# Patient Record
Sex: Female | Born: 2005 | Race: White | Hispanic: No | Marital: Single | Smoking: Never smoker
Health system: Southern US, Community
[De-identification: ages and names within clinical notes are randomized; demographics above are authoritative.]

## PROBLEM LIST (undated history)

## (undated) DIAGNOSIS — M21612 Bunion of left foot: Secondary | ICD-10-CM

## (undated) DIAGNOSIS — F909 Attention-deficit hyperactivity disorder, unspecified type: Secondary | ICD-10-CM

## (undated) DIAGNOSIS — M21611 Bunion of right foot: Secondary | ICD-10-CM

## (undated) HISTORY — DX: Bunion of left foot: M21.612

## (undated) HISTORY — DX: Bunion of right foot: M21.611

## (undated) HISTORY — DX: Attention-deficit hyperactivity disorder, unspecified type: F90.9

---
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
## 2023-01-30 ENCOUNTER — Encounter: Payer: PRIVATE HEALTH INSURANCE | Admitting: Obstetrics and Gynecology

## 2023-04-23 NOTE — Progress Notes (Deleted)
17 y.o. No obstetric history on file. Single Caucasian female here for NEW GYN/AEX.    PCP:     No LMP recorded. (Menstrual status: Irregular Periods).           Sexually active: {yes no:314532}  The current method of family planning is {contraception:315051}.    Exercising: {yes no:314532}  {types:19826} Smoker:  no  Health Maintenance: Pap:  n/a History of abnormal Pap:  no MMG:  n/a Colonoscopy:  n/a BMD:   n/a  Result  n/a TDaP:  *** Gardasil:   {YES NO:22349} HIV: Hep C: Screening Labs:  Hb today: ***, Urine today: ***   reports that she has never smoked. She has never used smokeless tobacco.  Past Medical History:  Diagnosis Date   ADHD (attention deficit hyperactivity disorder)    Bilateral bunions    Bilaterl Tailor bunions    No past surgical history on file.  Current Outpatient Medications  Medication Sig Dispense Refill   guanFACINE (INTUNIV) 2 MG TB24 ER tablet Take 2 mg by mouth daily.     JUNEL FE 1.5/30 1.5-30 MG-MCG tablet Take 1 tablet by mouth daily. (Patient not taking: Reported on 08/08/2020)     levothyroxine (SYNTHROID) 25 MCG tablet 50 MCG/DAY FOR THREE DAYS EACH WEEK AND 25 MCG/DAY FOR FOUR DAYS EACH WEEK. 100 tablet 1   Multiple Vitamins-Minerals (MULTIVITAMIN WITH MINERALS) tablet Take 1 tablet by mouth daily. (Patient not taking: Reported on 09/06/2021)     No current facility-administered medications for this visit.    Family History  Problem Relation Age of Onset   ADD / ADHD Mother    Asperger's syndrome Mother    Asthma Mother    Osteoarthritis Mother    Anxiety disorder Father    Depression Father    Polycystic ovary syndrome Sister    Scoliosis Sister    Migraines Sister    Sinusitis Sister    Kidney failure Maternal Grandmother    Hypertension Maternal Grandmother    Stroke Maternal Grandmother    Endometrial cancer Maternal Grandmother    Osteoarthritis Maternal Grandmother    Hypothyroidism Maternal Grandmother     Hypotension Maternal Grandmother    Bipolar disorder Maternal Grandmother    Depression Maternal Grandmother    Pancreatic cancer Maternal Grandfather    Hypertension Maternal Grandfather    Asperger's syndrome Maternal Grandfather    Memory loss Maternal Grandfather    Hyperlipidemia Paternal Grandmother    Heart murmur Paternal Grandmother    Hypothyroidism Paternal Grandmother    Osteoarthritis Paternal Grandfather     Review of Systems  Exam:   There were no vitals taken for this visit.    General appearance: alert, cooperative and appears stated age Head: normocephalic, without obvious abnormality, atraumatic Neck: no adenopathy, supple, symmetrical, trachea midline and thyroid normal to inspection and palpation Lungs: clear to auscultation bilaterally Breasts: normal appearance, no masses or tenderness, No nipple retraction or dimpling, No nipple discharge or bleeding, No axillary adenopathy Heart: regular rate and rhythm Abdomen: soft, non-tender; no masses, no organomegaly Extremities: extremities normal, atraumatic, no cyanosis or edema Skin: skin color, texture, turgor normal. No rashes or lesions Lymph nodes: cervical, supraclavicular, and axillary nodes normal. Neurologic: grossly normal  Pelvic: External genitalia:  no lesions              No abnormal inguinal nodes palpated.              Urethra:  normal appearing urethra with no masses, tenderness  or lesions              Bartholins and Skenes: normal                 Vagina: normal appearing vagina with normal color and discharge, no lesions              Cervix: no lesions              Pap taken: {yes no:314532} Bimanual Exam:  Uterus:  normal size, contour, position, consistency, mobility, non-tender              Adnexa: no mass, fullness, tenderness              Rectal exam: {yes no:314532}.  Confirms.              Anus:  normal sphincter tone, no lesions  Chaperone was present for exam:  ***  Assessment:    Well woman visit with gynecologic exam.   Plan: Mammogram screening discussed. Self breast awareness reviewed. Pap and HR HPV as above. Guidelines for Calcium, Vitamin D, regular exercise program including cardiovascular and weight bearing exercise.   Follow up annually and prn.   Additional counseling given.  {yes [id number]. _______ minutes face to face time of which over 50% was spent in counseling.    After visit summary provided.

## 2023-05-07 ENCOUNTER — Encounter: Payer: PRIVATE HEALTH INSURANCE | Admitting: Obstetrics and Gynecology

## 2023-12-16 ENCOUNTER — Encounter (INDEPENDENT_AMBULATORY_CARE_PROVIDER_SITE_OTHER): Payer: Self-pay

## 2023-12-29 ENCOUNTER — Encounter (INDEPENDENT_AMBULATORY_CARE_PROVIDER_SITE_OTHER): Payer: Self-pay
